# Patient Record
Sex: Female | Born: 2000 | Hispanic: No | Marital: Single | Smoking: Never smoker
Health system: Southern US, Community
[De-identification: ages and names within clinical notes are randomized; demographics above are authoritative.]

## PROBLEM LIST (undated history)

## (undated) DIAGNOSIS — E785 Hyperlipidemia, unspecified: Secondary | ICD-10-CM

---
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
## 2019-04-13 IMAGING — CR DG FINGER MIDDLE 2+V*R*
3 series · 3 of 3 positions shown · non-contrast
Comparison: None.

CLINICAL DATA: Pain following injury

EXAM:
RIGHT THIRD FINGER 2+V

[finger ap]
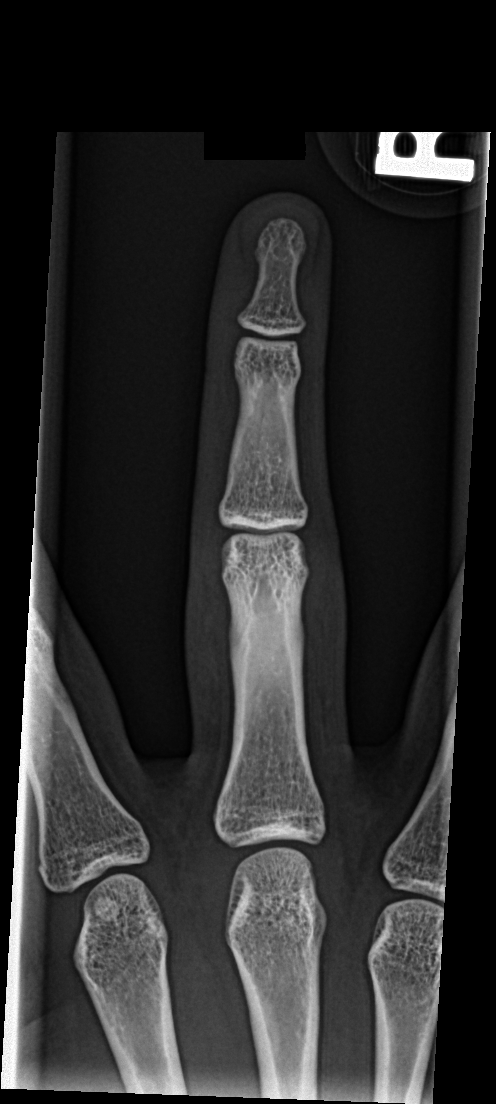

[finger obl]
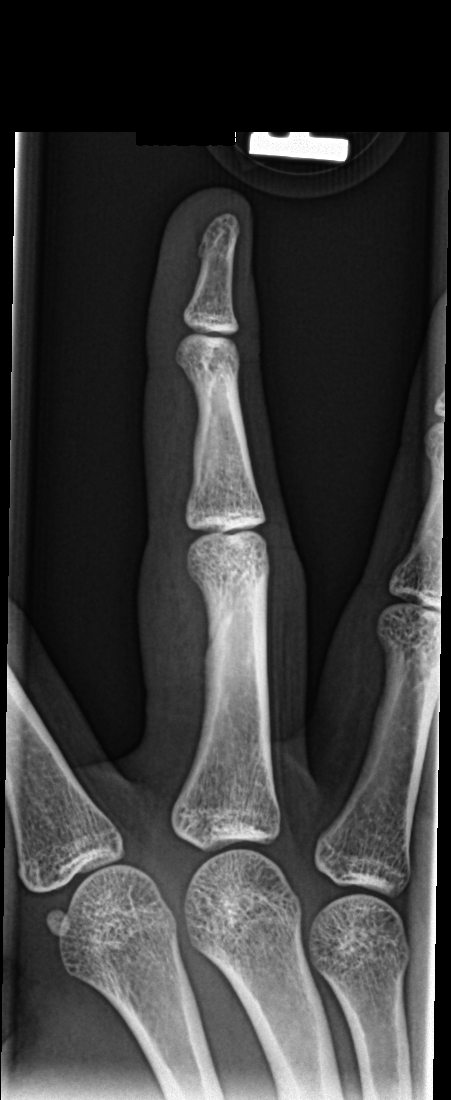

[finger lat]
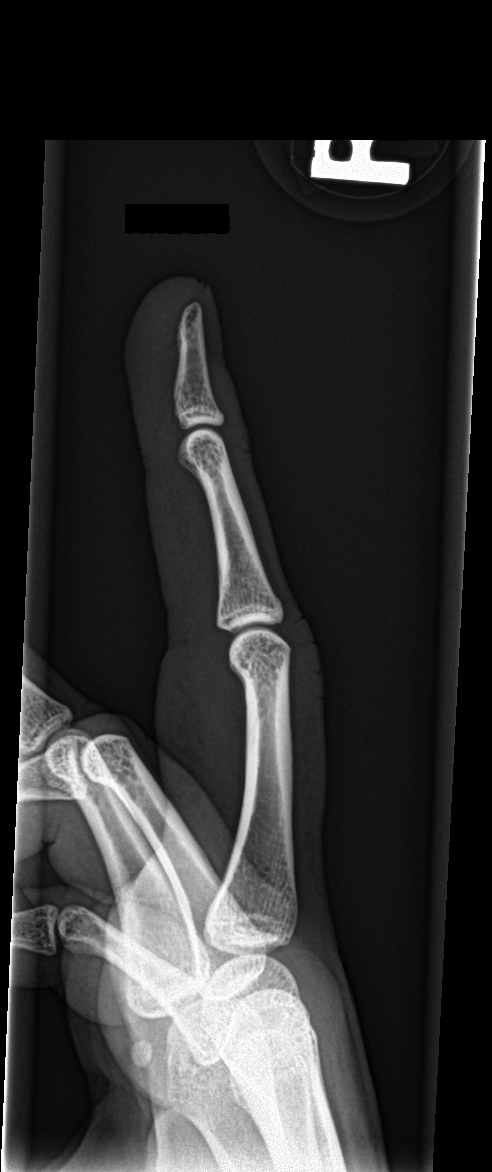

[3 of 3 positions shown; findings below may reference images not displayed]

FINDINGS: Frontal, oblique, and lateral views were obtained. There is mild
soft tissue swelling. No fracture or dislocation. Joint spaces
appear normal. No erosive change or radiopaque foreign body.
IMPRESSION: Mild soft tissue swelling. No fracture or dislocation. No evident
arthropathy.

## 2020-09-25 ENCOUNTER — Other Ambulatory Visit: Payer: Self-pay

## 2020-09-25 ENCOUNTER — Ambulatory Visit (INDEPENDENT_AMBULATORY_CARE_PROVIDER_SITE_OTHER): Payer: BC Managed Care – PPO | Admitting: Podiatry

## 2020-09-25 ENCOUNTER — Ambulatory Visit (INDEPENDENT_AMBULATORY_CARE_PROVIDER_SITE_OTHER): Payer: BC Managed Care – PPO

## 2020-09-25 VITALS — BP 116/71 | HR 81 | Temp 98.9°F | Resp 16

## 2020-09-25 DIAGNOSIS — Q6689 Other  specified congenital deformities of feet: Secondary | ICD-10-CM

## 2020-09-25 DIAGNOSIS — M659 Synovitis and tenosynovitis, unspecified: Secondary | ICD-10-CM

## 2020-09-25 MED ORDER — MELOXICAM 15 MG PO TABS: 15.0000 mg | ORAL_TABLET | Freq: Every day | ORAL | 3 refills | Status: DC

## 2020-09-25 NOTE — Progress Notes (Signed)
   Subjective:  19 y.o. female presenting today for evaluation of right ankle pain.  Patient has a history of right ankle pain in the past.  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  She has noticed increased pain to her right ankle with the clogging.  She has not done anything for treatment.  She presents for further treatment evaluation   Past Medical History:  Diagnosis Date  . Hyperlipidemia      Objective / Physical Exam:  General:  The patient is alert and oriented x3 in no acute distress. Dermatology:  Skin is warm, dry and supple bilateral lower extremities. Negative for open lesions or macerations. Vascular:  Palpable pedal pulses bilaterally. No edema or erythema noted. Capillary refill within normal limits. Neurological:  Epicritic and protective threshold grossly intact bilaterally.  Musculoskeletal Exam:  Pain on palpation to the anterior lateral medial aspects of the patient's right ankle. Mild edema noted. Range of motion within normal limits to all pedal and ankle joints bilateral. Muscle strength 5/5 in all groups bilateral.   Radiographic Exam:  Normal osseous mineralization. Joint spaces preserved. No fracture/dislocation/boney destruction.    Assessment: 1.  Right ankle synovitis  Plan of Care:  1. Patient was evaluated. X-Rays reviewed.  2.  Patient declined injection today Three.  Recommend OTC arch supports.  Patient has very high arches and arch supports would help alleviate pressure Four.  Refill prescription for meloxicam 15 mg daily Five.  Return to clinic as needed  *Going to [employer].  Elementary education.   Felecia Shelling, DPM [facility]  Dr. Felecia Shelling, DPM    [address]                                        [place], [place] [address]                Office [phone]  Fax [phone]

## 2021-08-31 ENCOUNTER — Other Ambulatory Visit: Payer: Self-pay | Admitting: Podiatry

## 2021-08-31 NOTE — Telephone Encounter (Signed)
Please advise, patient may need a f/u appointment since has not been seen in 1 year.(2021)

## 2021-09-14 NOTE — Telephone Encounter (Signed)
Meds approved.

## 2022-07-13 ENCOUNTER — Inpatient Hospital Stay: Admission: RE | Admit: 2022-07-13 | Payer: Self-pay | Source: Ambulatory Visit

## 2022-07-13 ENCOUNTER — Ambulatory Visit
Admission: EM | Admit: 2022-07-13 | Discharge: 2022-07-13 | Disposition: A | Discharge status: Home / Self Care | Payer: BC Managed Care – PPO | Attending: Emergency Medicine | Admitting: Emergency Medicine

## 2022-07-13 DIAGNOSIS — J069 Acute upper respiratory infection, unspecified: Secondary | ICD-10-CM | POA: Diagnosis not present

## 2022-07-13 DIAGNOSIS — Z20822 Contact with and (suspected) exposure to covid-19: Secondary | ICD-10-CM | POA: Insufficient documentation

## 2022-07-13 LAB — GROUP A STREP BY PCR: Group A Strep by PCR: NOT DETECTED

## 2022-07-13 LAB — SARS CORONAVIRUS 2 BY RT PCR: SARS Coronavirus 2 by RT PCR: NEGATIVE

## 2022-07-13 MED ORDER — IPRATROPIUM BROMIDE 0.06 % NA SOLN: 2.0000 | Freq: Four times a day (QID) | NASAL | 12 refills | Status: DC

## 2022-07-13 MED ORDER — PROMETHAZINE-DM 6.25-15 MG/5ML PO SYRP: 5.0000 mL | ORAL_SOLUTION | Freq: Four times a day (QID) | ORAL | 0 refills | Status: DC | PRN

## 2022-07-13 MED ORDER — BENZONATATE 100 MG PO CAPS: 200.0000 mg | ORAL_CAPSULE | Freq: Three times a day (TID) | ORAL | 0 refills | Status: DC

## 2022-07-13 NOTE — ED Provider Notes (Signed)
[facility]    CSN: [id number] Arrival date & time: 07/13/22  1348      History   Chief Complaint Chief Complaint  Patient presents with   Sore Throat    HPI Michelle Pena is a 21 y.o. female.   HPI  21 year old female here for evaluation of sore throat.  Patient reports her symptoms began 2 days ago with nasal congestion, clear nasal discharge, and a sore throat.  She is also endorsing a nonproductive cough.  Yesterday she woke up with a scratchy voice and this morning she woke up and reports that she is almost completely lost her voice.  She has not had a fever, denies ear pain, denies shortness breath or wheezing, and denies GI complaints.  No other sick contacts.  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 No pertinent surgical history.  OB History   No obstetric history on file.      Home Medications    Prior to Admission medications   Medication Sig Start Date End Date Taking? Authorizing Provider  benzonatate (TESSALON) 100 MG capsule Take 2 capsules (200 mg total) by mouth every 8 (eight) hours. 07/13/22  Yes Becky Augusta, NP  ipratropium (ATROVENT) 0.06 % nasal spray Place 2 sprays into both nostrils 4 (four) times daily. 07/13/22  Yes Becky Augusta, NP  promethazine-dextromethorphan (PROMETHAZINE-DM) 6.25-15 MG/5ML syrup Take 5 mLs by mouth 4 (four) times daily as needed. 07/13/22  Yes Becky Augusta, NP  rosuvastatin (CRESTOR) 20 MG tablet Take 20 mg by mouth daily.   Yes [provider]    Family History Family History  Problem Relation Age of Onset   Hyperlipidemia Mother    Migraines Mother    Diabetes Father    Hypertension Father     Social History Social History   Tobacco Use   Smoking status: Never   Smokeless tobacco: Never  Vaping Use   Vaping Use: Never used  Substance Use Topics   Alcohol  use: No    Alcohol/week: 0.0 standard drinks of alcohol   Drug use: No     Allergies   Patient has no known allergies.   Review of Systems Review of Systems  Constitutional:  Negative for fever.  HENT:  Positive for congestion, postnasal drip, rhinorrhea, sore throat and voice change. Negative for ear pain.   Respiratory:  Positive for cough. Negative for shortness of breath and wheezing.   Gastrointestinal:  Negative for diarrhea, nausea and vomiting.  Skin:  Negative for rash.  Hematological: Negative.   Psychiatric/Behavioral: Negative.       Physical Exam Triage Vital Signs ED Triage Vitals  Enc Vitals Group     BP 07/13/22 1414 (!) 141/97     Pulse Rate 07/13/22 1414 (!) 118     Resp --      Temp --      Temp src --      SpO2 07/13/22 1414 99 %     Weight 07/13/22 1415 189 lb (85.7 kg)     Height 07/13/22 1415 5\' 8"  (1.727 m)     Head Circumference --      Peak Flow --      Pain Score 07/13/22 1420 3     Pain Loc --      Pain Edu? --      Excl. in  GC? --    No data found.  Updated Vital Signs BP (!) 141/97 (BP Location: Left Arm)   Pulse (!) 118   Ht 5\' 8"  (1.727 m)   Wt 189 lb (85.7 kg)   LMP 06/29/2022 (Approximate)   SpO2 99%   BMI 28.74 kg/m   Visual Acuity Right Eye Distance:   Left Eye Distance:   Bilateral Distance:    Right Eye Near:   Left Eye Near:    Bilateral Near:     Physical Exam Vitals and nursing note reviewed.  Constitutional:      Appearance: Normal appearance. She is not ill-appearing.  HENT:     Head: Normocephalic and atraumatic.     Right Ear: Tympanic membrane, ear canal and external ear normal. There is no impacted cerumen.     Left Ear: Tympanic membrane, ear canal and external ear normal. There is no impacted cerumen.     Nose: Congestion and rhinorrhea present.     Mouth/Throat:     Mouth: Mucous membranes are moist.     Pharynx: Oropharynx is clear. Posterior oropharyngeal erythema present. No oropharyngeal  exudate.  Cardiovascular:     Rate and Rhythm: Normal rate and regular rhythm.     Pulses: Normal pulses.     Heart sounds: Normal heart sounds. No murmur heard.    No friction rub. No gallop.  Pulmonary:     Effort: Pulmonary effort is normal.     Breath sounds: Normal breath sounds. No wheezing, rhonchi or rales.  Musculoskeletal:     Cervical back: Normal range of motion and neck supple.  Lymphadenopathy:     Cervical: No cervical adenopathy.  Skin:    General: Skin is warm and dry.     Capillary Refill: Capillary refill takes less than 2 seconds.     Findings: No erythema or rash.  Neurological:     General: No focal deficit present.     Mental Status: She is alert and oriented to person, place, and time.  Psychiatric:        Mood and Affect: Mood normal.        Behavior: Behavior normal.        Thought Content: Thought content normal.        Judgment: Judgment normal.      UC Treatments / Results  Labs (all labs ordered are listed, but only abnormal results are displayed) Labs Reviewed  GROUP A STREP BY PCR  SARS CORONAVIRUS 2 BY RT PCR    EKG   Radiology No results found.  Procedures Procedures (including critical care time)  Medications Ordered in UC Medications - No data to display  Initial Impression / Assessment and Plan / UC Course  I have reviewed the triage vital signs and the nursing notes.  Pertinent labs & imaging results that were available during my care of the patient were reviewed by me and considered in my medical decision making (see chart for details).   Patient is a very pleasant, nontoxic-appearing 21 year old female here for evaluation of upper respiratory complaints with the most predominant complaint being a sore throat and hoarseness.  Her physical exam reveals pearly-gray tympanic membranes bilaterally with normal light reflex and clear external auditory canals.  Nasal mucosa is erythematous and edematous with clear discharge in both  nares.  Oropharyngeal exam reveals 2+ erythematous and edematous tonsillar pillars with no exudate.  Posterior oropharynx also has erythema and injection with clear postnasal drip.  No anterior cervical lymphadenopathy  appreciated on exam.  Cardiopulmonary exam reveals S1-S2 heart sounds with regular rate and rhythm lung sounds are clear to auscultation in all fields.  I will check strep PCR given the patient's throat is erythematous and edematous and is her most significant symptom.  Her symptoms are consistent with a viral upper respiratory infection as well.  Strep PCR is negative.  I discussed with patient that her symptoms are most closely associated with a viral upper respiratory infection.  I did offer COVID testing and she has consented.  I will order a COVID PCR.  COVID PCR is negative.  I will discharge patient home with a diagnosis of viral URI with a cough and treat her with Atrovent nasal spray, Tessalon Perles, and Promethazine DM cough syrup.   Final Clinical Impressions(s) / UC Diagnoses   Final diagnoses:  Viral URI with cough     Discharge Instructions      Your test today were negative for both COVID and strep.  I believe you have a viral upper respiratory infection.  Please use the following medications to help your symptoms.  Use the Atrovent nasal spray, 2 squirts in each nostril every 6 hours, as needed for runny nose and postnasal drip.  Use the Tessalon Perles every 8 hours during the day.  Take them with a small sip of water.  They may give you some numbness to the base of your tongue or a metallic taste in your mouth, this is normal.  Use the Promethazine DM cough syrup at bedtime for cough and congestion.  It will make you drowsy so do not take it during the day.  Use over-the-counter Tylenol and ibuprofen according to package instructions as needed for any pain you might have.  Return for reevaluation or see your primary care provider for any new or  worsening symptoms.      ED Prescriptions     Medication Sig Dispense Auth. Provider   benzonatate (TESSALON) 100 MG capsule Take 2 capsules (200 mg total) by mouth every 8 (eight) hours. 21 capsule Becky Augusta, NP   ipratropium (ATROVENT) 0.06 % nasal spray Place 2 sprays into both nostrils 4 (four) times daily. 15 mL Becky Augusta, NP   promethazine-dextromethorphan (PROMETHAZINE-DM) 6.25-15 MG/5ML syrup Take 5 mLs by mouth 4 (four) times daily as needed. 118 mL Becky Augusta, NP      PDMP not reviewed this encounter.   Becky Augusta, NP 07/13/22 [id number]

## 2022-07-13 NOTE — ED Triage Notes (Signed)
Pt states sore throat started Sunday.  Woke up today w/o voice.

## 2022-07-13 NOTE — Discharge Instructions (Addendum)
Your test today were negative for both COVID and strep.  I believe you have a viral upper respiratory infection.  Please use the following medications to help your symptoms.  Use the Atrovent nasal spray, 2 squirts in each nostril every 6 hours, as needed for runny nose and postnasal drip.  Use the Tessalon Perles every 8 hours during the day.  Take them with a small sip of water.  They may give you some numbness to the base of your tongue or a metallic taste in your mouth, this is normal.  Use the Promethazine DM cough syrup at bedtime for cough and congestion.  It will make you drowsy so do not take it during the day.  Use over-the-counter Tylenol and ibuprofen according to package instructions as needed for any pain you might have.  Return for reevaluation or see your primary care provider for any new or worsening symptoms.

## 2023-09-22 ENCOUNTER — Ambulatory Visit
Admission: EM | Admit: 2023-09-22 | Discharge: 2023-09-22 | Disposition: A | Discharge status: Home / Self Care | Payer: 59

## 2023-09-22 DIAGNOSIS — B084 Enteroviral vesicular stomatitis with exanthem: Secondary | ICD-10-CM | POA: Diagnosis not present

## 2023-09-22 DIAGNOSIS — K1370 Unspecified lesions of oral mucosa: Secondary | ICD-10-CM | POA: Diagnosis not present

## 2023-09-22 NOTE — ED Provider Notes (Signed)
Renaldo Fiddler    CSN: [id number] Arrival date & time: 09/22/23  1611      History   Chief Complaint Chief Complaint  Patient presents with   Mouth Lesions    HPI Michelle Pena is a 22 y.o. female.   22 year old female, Michelle Pena, presents to urgent care for evaluation of mouth lesions.  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  Patient denies fevers.  No treatment prior to arrival.  Patient is eating and drinking well.  The history is provided by the patient. No language interpreter was used.    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 No pertinent surgical history.  OB History   No obstetric history on file.      Home Medications    Prior to Admission medications   Medication Sig Start Date End Date Taking? Authorizing Provider  benzonatate (TESSALON) 100 MG capsule Take 2 capsules (200 mg total) by mouth every 8 (eight) hours. 07/13/22   Becky Augusta, NP  ipratropium (ATROVENT) 0.06 % nasal spray Place 2 sprays into both nostrils 4 (four) times daily. 07/13/22   Becky Augusta, NP  promethazine-dextromethorphan (PROMETHAZINE-DM) 6.25-15 MG/5ML syrup Take 5 mLs by mouth 4 (four) times daily as needed. 07/13/22   Becky Augusta, NP  rosuvastatin (CRESTOR) 20 MG tablet Take 20 mg by mouth daily.    [provider]    Family History Family History  Problem Relation Age of Onset   Hyperlipidemia Mother    Migraines Mother    Diabetes Father    Hypertension Father     Social History Social History   Tobacco Use   Smoking status: Never   Smokeless tobacco: Never  Vaping Use   Vaping status: Never Used  Substance Use Topics   Alcohol use: No    Alcohol/week:  0.0 standard drinks of alcohol   Drug use: No     Allergies   Amoxicillin   Review of Systems Review of Systems  Constitutional:  Negative for fever.  HENT:  Positive for mouth sores.   All other systems reviewed and are negative.    Physical Exam Triage Vital Signs ED Triage Vitals  Encounter Vitals Group     BP 09/22/23 1627 113/74     Systolic BP Percentile --      Diastolic BP Percentile --      Pulse Rate 09/22/23 1627 94     Resp 09/22/23 1627 16     Temp 09/22/23 1627 98.8 F (37.1 C)     Temp Source 09/22/23 1627 Oral     SpO2 09/22/23 1627 98 %     Weight --      Height --      Head Circumference --      Peak Flow --      Pain Score 09/22/23 1617 3     Pain Loc --      Pain Education --      Exclude from Growth Chart --    No data found.  Updated Vital Signs BP 113/74 (BP Location: Left Arm)   Pulse 94   Temp 98.8 F (37.1 C) (Oral)   Resp 16   LMP  09/15/2023 (Approximate)   SpO2 98%   Visual Acuity Right Eye Distance:   Left Eye Distance:   Bilateral Distance:    Right Eye Near:   Left Eye Near:    Bilateral Near:     Physical Exam Vitals and nursing note reviewed.  Constitutional:      Appearance: Normal appearance. She is well-developed and well-groomed. She is not ill-appearing.  HENT:     Head: Normocephalic.     Right Ear: Tympanic membrane normal.     Left Ear: Tympanic membrane normal.     Nose: Nose normal.     Mouth/Throat:     Lips: Pink.     Mouth: Mucous membranes are moist. Oral lesions present.     Palate: Lesions present.     Tonsils: No tonsillar exudate or tonsillar abscesses.  Cardiovascular:     Rate and Rhythm: Normal rate and regular rhythm.     Pulses: Normal pulses.     Heart sounds: Normal heart sounds.  Pulmonary:     Effort: Pulmonary effort is normal.     Breath sounds: Normal breath sounds and air entry.  Musculoskeletal:     Cervical back: Normal range of motion.  Lymphadenopathy:     Cervical:  No cervical adenopathy.  Neurological:     General: No focal deficit present.     Mental Status: She is alert and oriented to person, place, and time.     GCS: GCS eye subscore is 4. GCS verbal subscore is 5. GCS motor subscore is 6.     Cranial Nerves: No cranial nerve deficit.     Sensory: No sensory deficit.  Psychiatric:        Attention and Perception: Attention normal.        Mood and Affect: Mood normal.        Speech: Speech normal.        Behavior: Behavior normal. Behavior is cooperative.      UC Treatments / Results  Labs (all labs ordered are listed, but only abnormal results are displayed) Labs Reviewed - No data to display  EKG   Radiology No results found.  Procedures Procedures (including critical care time)  Medications Ordered in UC Medications - No data to display  Initial Impression / Assessment and Plan / UC Course  I have reviewed the triage vital signs and the nursing notes.  Pertinent labs & imaging results that were available during my care of the patient were reviewed by me and considered in my medical decision making (see chart for details).    Discussed exam findings and plan of care with patient, strict go to ER precautions given.   Patient verbalized understanding to this provider.  Ddx: Foot mouth disease, mouth lesions, viral illness, aphthous ulcers, allergies Final Clinical Impressions(s) / UC Diagnoses   Final diagnoses:  Hand, foot and mouth disease  Lesion of mouth     Discharge Instructions      Stay hydrated. You are considered contagious, handout given regarding precautions.    ED Prescriptions   None    PDMP not reviewed this encounter.   Clancy Gourd, NP 09/22/23 2027

## 2023-09-22 NOTE — Discharge Instructions (Addendum)
Stay hydrated. You are considered contagious, handout given regarding precautions.

## 2023-09-22 NOTE — ED Triage Notes (Signed)
Patient presents to UC for hand foot mouth x today. 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  Denies fever.

## 2023-11-23 ENCOUNTER — Ambulatory Visit
Admission: RE | Admit: 2023-11-23 | Discharge: 2023-11-23 | Disposition: A | Discharge status: Home / Self Care | Payer: 59 | Source: Ambulatory Visit | Attending: Emergency Medicine | Admitting: Emergency Medicine

## 2023-11-23 VITALS — BP 129/82 | HR 76 | Temp 98.2°F | Resp 16

## 2023-11-23 DIAGNOSIS — H9202 Otalgia, left ear: Secondary | ICD-10-CM | POA: Diagnosis not present

## 2023-11-23 MED ORDER — CIPROFLOXACIN-DEXAMETHASONE 0.3-0.1 % OT SUSP: 4.0000 [drp] | Freq: Two times a day (BID) | OTIC | 0 refills | Status: DC

## 2023-11-23 NOTE — Discharge Instructions (Addendum)
 Your evaluated for discomfort to your ears, on exam there is no overt signs of infection, symptoms possibly related to sinuses  On Exam your right ear is clogged by wax  Prophylactically being placed on eardrops, Place 4 drops of Ciprodex  which is a mixture of antibiotic and a steroid into the left ear every morning and every evening for 7 days  You may continue use of Sudafed and Advil  as needed, if continuing use of allergy eardrops with at least 5 minutes between administration so medicines do not mix  You may use Tylenol or ibuprofen  for management of discomfort  May hold warm compresses to the ear for additional comfort  Please not attempted any ear cleaning or object or fluid placement into the ear canal to prevent further irritation

## 2023-11-23 NOTE — ED Provider Notes (Signed)
 Michelle Pena    CSN: [id number] Arrival date & time: 11/23/23  1555      History   Chief Complaint Chief Complaint  Patient presents with   Ear Fullness    Left ear pain - Entered by patient   Otalgia    HPI Michelle Pena is a 23 y.o. female.   Patient presents for evaluation of left-sided ear pain and fullness present for 2 days.  Denies fever, nasal congestion, cough or sore throat.  No known sick contacts prior.  Tolerating food and liquids.  Has attempted use of Advil , Sudafed and over-the-counter allergy eardrops with no improvement.  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 No pertinent surgical history.  OB History   No obstetric history on file.      Home Medications    Prior to Admission medications   Medication Sig Start Date End Date Taking? Authorizing Provider  ciprofloxacin -dexamethasone  (CIPRODEX ) OTIC suspension Place 4 drops into the left ear 2 (two) times daily. 11/23/23  Yes Rasha Ibe R, NP  rosuvastatin (CRESTOR) 20 MG tablet Take 20 mg by mouth daily.   Yes [provider]  benzonatate  (TESSALON ) 100 MG capsule Take 2 capsules (200 mg total) by mouth every 8 (eight) hours. 07/13/22   Bernardino Ditch, NP  ipratropium (ATROVENT ) 0.06 % nasal spray Place 2 sprays into both nostrils 4 (four) times daily. 07/13/22   Bernardino Ditch, NP  promethazine -dextromethorphan (PROMETHAZINE -DM) 6.25-15 MG/5ML syrup Take 5 mLs by mouth 4 (four) times daily as needed. 07/13/22   Bernardino Ditch, NP    Family History Family History  Problem Relation Age of Onset   Hyperlipidemia Mother    Migraines Mother    Diabetes Father    Hypertension Father     Social History Social History   Tobacco Use   Smoking status: Never   Smokeless tobacco: Never   Vaping Use   Vaping status: Never Used  Substance Use Topics   Alcohol use: No    Alcohol/week: 0.0 standard drinks of alcohol   Drug use: No     Allergies   Amoxicillin    Review of Systems Review of Systems   Physical Exam Triage Vital Signs ED Triage Vitals  Encounter Vitals Group     BP 11/23/23 1614 129/82     Systolic BP Percentile --      Diastolic BP Percentile --      Pulse Rate 11/23/23 1614 76     Resp 11/23/23 1614 16     Temp 11/23/23 1614 98.2 F (36.8 C)     Temp Source 11/23/23 1614 Oral     SpO2 11/23/23 1614 98 %     Weight --      Height --      Head Circumference --      Peak Flow --      Pain Score 11/23/23 1615 5     Pain Loc --      Pain Education --      Exclude from Growth Chart --    No data found.  Updated Vital Signs BP 129/82 (BP Location: Left Arm)   Pulse 76   Temp 98.2 F (36.8 C) (Oral)   Resp 16   LMP 11/07/2023 (Exact Date)  SpO2 98%   Visual Acuity Right Eye Distance:   Left Eye Distance:   Bilateral Distance:    Right Eye Near:   Left Eye Near:    Bilateral Near:     Physical Exam Constitutional:      Appearance: Normal appearance.  HENT:     Head: Normocephalic.     Right Ear: Tympanic membrane, ear canal and external ear normal.     Left Ear: Tympanic membrane, ear canal and external ear normal.  Eyes:     Extraocular Movements: Extraocular movements intact.  Pulmonary:     Effort: Pulmonary effort is normal.  Neurological:     Mental Status: She is alert and oriented to person, place, and time.      UC Treatments / Results  Labs (all labs ordered are listed, but only abnormal results are displayed) Labs Reviewed - No data to display  EKG   Radiology No results found.  Procedures Procedures (including critical care time)  Medications Ordered in UC Medications - No data to display  Initial Impression / Assessment and Plan / UC Course  I have reviewed the triage vital signs and the  nursing notes.  Pertinent labs & imaging results that were available during my care of the patient were reviewed by me and considered in my medical decision making (see chart for details).  Otalgia of left ear  No overt abnormality indicating infection noted on exam, prophylactically placed on Ciprodex  as she is symptomatic, etiology possibly related to sinuses, discussed with patient recommended continued use of Sudafed and Advil  for management of pain.,advised against ear cleaning, may use over-the-counter analgesics and warm compresses to the external ear for comfort, may follow-up if symptoms persist worsen or recur  Final Clinical Impressions(s) / UC Diagnoses   Final diagnoses:  Otalgia of left ear     Discharge Instructions      Your evaluated for discomfort to your ears, on exam there is no overt signs of infection, symptoms possibly related to sinuses  On Exam your right ear is clogged by wax  Prophylactically being placed on eardrops, Place 4 drops of Ciprodex  which is a mixture of antibiotic and a steroid into the left ear every morning and every evening for 7 days  You may continue use of Sudafed and Advil  as needed, if continuing use of allergy eardrops with at least 5 minutes between administration so medicines do not mix  You may use Tylenol or ibuprofen  for management of discomfort  May hold warm compresses to the ear for additional comfort  Please not attempted any ear cleaning or object or fluid placement into the ear canal to prevent further irritation    ED Prescriptions     Medication Sig Dispense Auth. Provider   ciprofloxacin -dexamethasone  (CIPRODEX ) OTIC suspension Place 4 drops into the left ear 2 (two) times daily. 7.5 mL Michelle Shelba SAUNDERS, NP      PDMP not reviewed this encounter.   Michelle Shelba SAUNDERS, NP 11/23/23 1644

## 2023-11-23 NOTE — ED Triage Notes (Signed)
 Left ear pain/ fullness that started yesterday. Taking Advil and sudafed and OTC cold and flu allergy ear drops with no relief of symptoms.

## 2023-12-22 DIAGNOSIS — R6889 Other general symptoms and signs: Secondary | ICD-10-CM | POA: Diagnosis not present

## 2023-12-30 ENCOUNTER — Ambulatory Visit
Admission: EM | Admit: 2023-12-30 | Discharge: 2023-12-30 | Disposition: A | Discharge status: Home / Self Care | Payer: 59 | Attending: Internal Medicine | Admitting: Internal Medicine

## 2023-12-30 ENCOUNTER — Encounter: Payer: Self-pay | Admitting: Internal Medicine

## 2023-12-30 ENCOUNTER — Other Ambulatory Visit: Payer: Self-pay

## 2023-12-30 DIAGNOSIS — H6692 Otitis media, unspecified, left ear: Secondary | ICD-10-CM | POA: Diagnosis not present

## 2023-12-30 MED ORDER — DOXYCYCLINE HYCLATE 100 MG PO CAPS: 100.0000 mg | ORAL_CAPSULE | Freq: Two times a day (BID) | ORAL | 0 refills | Status: DC

## 2023-12-30 NOTE — ED Triage Notes (Addendum)
 Patient presents to [facility] for evaluation of left ear pain since last night.  Just got over the flu.  Patient has had yellow-ish discharge since 2am this morning when she was woken up by the pain.  Patient states she used her home Cipro/Dex drops from her last ear infection and that is when the discharge started    Patient does not swallow pills well, but can do a capsule that can be opened and poured

## 2023-12-30 NOTE — ED Provider Notes (Signed)
 [facility]    CSN: [id number] Arrival date & time: 12/30/23  0807      History   Chief Complaint Chief Complaint  Patient presents with   Ear Pain    HPI Michelle Pena is a 23 y.o. female who presents with L ear pain since last night and woke up at 2 am with worse pain, so she applied Cipro/Dex ear drops she had from prior ear infection, and since then her ear started draining yellow matter. Had the flu last week and her cough is better. Has not had a fever this week.     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 No pertinent surgical history.  OB History   No obstetric history on file.      Home Medications    Prior to Admission medications   Medication Sig Start Date End Date Taking? Authorizing Provider  benzonatate (TESSALON) 100 MG capsule Take 2 capsules (200 mg total) by mouth every 8 (eight) hours. 07/13/22   Becky Augusta, NP  ciprofloxacin-dexamethasone (CIPRODEX) OTIC suspension Place 4 drops into the left ear 2 (two) times daily. 11/23/23   White, Elita Boone, NP  ipratropium (ATROVENT) 0.06 % nasal spray Place 2 sprays into both nostrils 4 (four) times daily. 07/13/22   Becky Augusta, NP  promethazine-dextromethorphan (PROMETHAZINE-DM) 6.25-15 MG/5ML syrup Take 5 mLs by mouth 4 (four) times daily as needed. 07/13/22   Becky Augusta, NP  rosuvastatin (CRESTOR) 20 MG tablet Take 20 mg by mouth daily.    [provider]    Family History Family History  Problem Relation Age of Onset   Hyperlipidemia Mother    Migraines Mother    Diabetes Father    Hypertension Father     Social History Social History   Tobacco Use   Smoking status: Never   Smokeless tobacco: Never  Vaping Use   Vaping status: Never Used  Substance Use Topics   Alcohol  use: No    Alcohol/week: 0.0 standard drinks of alcohol   Drug use: No     Allergies   Amoxicillin   Review of Systems Review of Systems As noted in HPI  Physical Exam Triage Vital Signs ED Triage Vitals  Encounter Vitals Group     BP 12/30/23 0820 126/83     Systolic BP Percentile --      Diastolic BP Percentile --      Pulse Rate 12/30/23 0820 93     Resp 12/30/23 0820 18     Temp 12/30/23 0820 98.7 F (37.1 C)     Temp Source 12/30/23 0820 Oral     SpO2 12/30/23 0820 98 %     Weight --      Height --      Head Circumference --      Peak Flow --      Pain Score 12/30/23 0821 5     Pain Loc --      Pain Education --      Exclude from Growth Chart --    No data found.  Updated Vital Signs BP 126/83 (BP Location: Left Arm)   Pulse 93   Temp 98.7 F (37.1 C) (Oral)   Resp 18   LMP 12/06/2023   SpO2 98%  Visual Acuity Right Eye Distance:   Left Eye Distance:   Bilateral Distance:    Right Eye Near:   Left Eye Near:    Bilateral Near:     Physical Exam Vitals and nursing note reviewed.  Constitutional:      General: She is not in acute distress.    Appearance: She is not toxic-appearing.  HENT:     Right Ear: Ear canal and external ear normal.     Left Ear: Ear canal normal.     Ears:     Comments: R TM pink and dull, L TM erythematous with cream color matter on bottom of it. I do not see a perforation, but the matter could be obscuring it.     Nose: Nose normal.  Eyes:     General: No scleral icterus.    Conjunctiva/sclera: Conjunctivae normal.  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     Heart sounds: No murmur heard. Pulmonary:     Effort: Pulmonary effort is normal.     Breath sounds: Normal breath sounds.  Musculoskeletal:        General: Normal range of motion.     Cervical back: Neck supple.  Lymphadenopathy:     Cervical: No cervical adenopathy.   Skin:    General: Skin is warm and dry.  Neurological:     Mental Status: She is oriented to person, place, and time.     Gait: Gait normal.  Psychiatric:        Mood and Affect: Mood normal.        Behavior: Behavior normal.        Thought Content: Thought content normal.        Judgment: Judgment normal.      UC Treatments / Results  Labs (all labs ordered are listed, but only abnormal results are displayed) Labs Reviewed - No data to display  EKG   Radiology No results found.  Procedures Procedures (including critical care time)  Medications Ordered in UC Medications - No data to display  Initial Impression / Assessment and Plan / UC Course  I have reviewed the triage vital signs and the nursing notes.  Purulent L OM  I placed her on Doxy capsules as noted since she can't swallow pills. May continue the Cipro drops Needs to FU with PCP in 7-10 days   Final Clinical Impressions(s) / UC Diagnoses   Final diagnoses:  None   Discharge Instructions   None    ED Prescriptions   None    PDMP not reviewed this encounter.   Garey Ham, [place] 12/30/23 [phone]

## 2023-12-30 NOTE — Discharge Instructions (Signed)
 Continue using the Cipro drops for 5 days as directed in the bottle Follow up with your primary care doctor to recheck your ear drum in 7-10 days

## 2024-01-03 DIAGNOSIS — H66015 Acute suppurative otitis media with spontaneous rupture of ear drum, recurrent, left ear: Secondary | ICD-10-CM | POA: Diagnosis not present

## 2024-01-26 DIAGNOSIS — H6983 Other specified disorders of Eustachian tube, bilateral: Secondary | ICD-10-CM | POA: Diagnosis not present

## 2024-01-26 DIAGNOSIS — H663X2 Other chronic suppurative otitis media, left ear: Secondary | ICD-10-CM | POA: Diagnosis not present

## 2024-02-01 DIAGNOSIS — E782 Mixed hyperlipidemia: Secondary | ICD-10-CM | POA: Diagnosis not present

## 2024-02-01 DIAGNOSIS — Z0000 Encounter for general adult medical examination without abnormal findings: Secondary | ICD-10-CM | POA: Diagnosis not present

## 2024-02-01 DIAGNOSIS — Z1330 Encounter for screening examination for mental health and behavioral disorders, unspecified: Secondary | ICD-10-CM | POA: Diagnosis not present

## 2024-02-01 DIAGNOSIS — Z131 Encounter for screening for diabetes mellitus: Secondary | ICD-10-CM | POA: Diagnosis not present

## 2024-02-01 DIAGNOSIS — Z1331 Encounter for screening for depression: Secondary | ICD-10-CM | POA: Diagnosis not present

## 2024-02-01 DIAGNOSIS — Z23 Encounter for immunization: Secondary | ICD-10-CM | POA: Diagnosis not present

## 2024-03-29 DIAGNOSIS — J302 Other seasonal allergic rhinitis: Secondary | ICD-10-CM | POA: Diagnosis not present

## 2024-03-29 DIAGNOSIS — J040 Acute laryngitis: Secondary | ICD-10-CM | POA: Diagnosis not present

## 2024-03-29 DIAGNOSIS — R0982 Postnasal drip: Secondary | ICD-10-CM | POA: Diagnosis not present

## 2024-05-02 ENCOUNTER — Other Ambulatory Visit: Payer: Self-pay

## 2024-05-02 ENCOUNTER — Encounter: Payer: Self-pay | Admitting: Emergency Medicine

## 2024-05-02 ENCOUNTER — Ambulatory Visit
Admission: EM | Admit: 2024-05-02 | Discharge: 2024-05-02 | Disposition: A | Discharge status: Home / Self Care | Attending: Family Medicine | Admitting: Family Medicine

## 2024-05-02 DIAGNOSIS — N3000 Acute cystitis without hematuria: Secondary | ICD-10-CM | POA: Diagnosis not present

## 2024-05-02 LAB — POCT URINALYSIS DIP (MANUAL ENTRY)
Bilirubin, UA: NEGATIVE
Glucose, UA: NEGATIVE mg/dL
Ketones, POC UA: NEGATIVE mg/dL
Nitrite, UA: NEGATIVE
Protein Ur, POC: NEGATIVE mg/dL
Spec Grav, UA: 1.015 (ref 1.010–1.025)
Urobilinogen, UA: 0.2 U/dL
pH, UA: 7.5 (ref 5.0–8.0)

## 2024-05-02 MED ORDER — PHENAZOPYRIDINE HCL 200 MG PO TABS: 200.0000 mg | ORAL_TABLET | Freq: Three times a day (TID) | ORAL | 0 refills | Status: AC | PRN

## 2024-05-02 MED ORDER — NITROFURANTOIN MONOHYD MACRO 100 MG PO CAPS: 100.0000 mg | ORAL_CAPSULE | Freq: Two times a day (BID) | ORAL | 0 refills | Status: AC

## 2024-05-02 MED ORDER — FLUCONAZOLE 150 MG PO TABS: 150.0000 mg | ORAL_TABLET | Freq: Once | ORAL | 0 refills | Status: AC

## 2024-05-02 NOTE — ED Triage Notes (Signed)
 Pt reports dysuria, urinary frequency with voiding since Tuesday.started taking AZO last night.

## 2024-05-02 NOTE — ED Provider Notes (Signed)
 [facility]    CSN: [id number] Arrival date & time: 05/02/24  0935      History   Chief Complaint Chief Complaint  Patient presents with   Dysuria    HPI Michelle Pena is a 23 y.o. female.   Patient presents today with 2-day history of dysuria, urinary frequency, and sharp pain at the end of voiding.  She also endorses a foul urinary odor.  No new urinary incontinence, abdominal pain, flank pain, fever, nausea/vomiting, or vaginal discharge.  Is not sexually active and is not concerned for STIs.  Reports last menstrual cycle 04/27/2024, completely stopped yesterday.  Has taken Azo which did help with symptoms until the medicine wore off.  Reports she went to the beach last week and thinks that may have caused symptoms.  Reports history of UTI, last was about 1 year ago.    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 No pertinent surgical history.  OB History   No obstetric history on file.      Home Medications    Prior to Admission medications   Medication Sig Start Date End Date Taking? Authorizing Provider  fluconazole (DIFLUCAN) 150 MG tablet Take 1 tablet (150 mg total) by mouth once for 1 dose. 05/02/24 05/02/24 Yes Wilhemena Harbour, NP  nitrofurantoin, macrocrystal-monohydrate, (MACROBID) 100 MG capsule Take 1 capsule (100 mg total) by mouth 2 (two) times daily for 5 days. 05/02/24 05/07/24 Yes Wilhemena Harbour, NP  phenazopyridine (PYRIDIUM) 200 MG tablet Take 1 tablet (200 mg total) by mouth 3 (three) times daily as needed for pain (bladder pain). 05/02/24  Yes Thena Fireman A, NP  rosuvastatin (CRESTOR) 20 MG tablet Take 20 mg by mouth daily.    [provider]    Family History Family History  Problem Relation Age of Onset    Hyperlipidemia Mother    Migraines Mother    Diabetes Father    Hypertension Father     Social History Social History   Tobacco Use   Smoking status: Never   Smokeless tobacco: Never  Vaping Use   Vaping status: Never Used  Substance Use Topics   Alcohol use: No    Alcohol/week: 0.0 standard drinks of alcohol   Drug use: No     Allergies   Amoxicillin    Review of Systems Review of Systems Per HPI  Physical Exam Triage Vital Signs ED Triage Vitals  Encounter Vitals Group     BP 05/02/24 1016 128/85     Girls Systolic BP Percentile --      Girls Diastolic BP Percentile --      Boys Systolic BP Percentile --      Boys Diastolic BP Percentile --      Pulse Rate 05/02/24 1016 95     Resp 05/02/24 1016 20     Temp 05/02/24 1016 98.4 F (36.9 C)     Temp Source 05/02/24 1016 Oral     SpO2 05/02/24 1016 97 %     Weight --      Height --      Head Circumference --      Peak Flow --      Pain Score 05/02/24 1014 0     Pain Loc --  Pain Education --      Exclude from Growth Chart --    No data found.  Updated Vital Signs BP 128/85 (BP Location: Right Arm)   Pulse 95   Temp 98.4 F (36.9 C) (Oral)   Resp 20   LMP 04/27/2024 (Approximate)   SpO2 97%   Visual Acuity Right Eye Distance:   Left Eye Distance:   Bilateral Distance:    Right Eye Near:   Left Eye Near:    Bilateral Near:     Physical Exam Vitals and nursing note reviewed.  Constitutional:      General: She is not in acute distress.    Appearance: She is not toxic-appearing.  HENT:     Mouth/Throat:     Mouth: Mucous membranes are moist.     Pharynx: Oropharynx is clear.  Pulmonary:     Effort: Pulmonary effort is normal. No respiratory distress.  Abdominal:     General: Abdomen is flat. Bowel sounds are normal. There is no distension.     Palpations: Abdomen is soft. There is no mass.     Tenderness: There is no abdominal tenderness. There is no right CVA tenderness, left CVA  tenderness or guarding.   Skin:    General: Skin is warm and dry.     Coloration: Skin is not jaundiced or pale.     Findings: No erythema.   Neurological:     Mental Status: She is alert and oriented to person, place, and time.     Motor: No weakness.     Gait: Gait normal.   Psychiatric:        Behavior: Behavior is cooperative.      UC Treatments / Results  Labs (all labs ordered are listed, but only abnormal results are displayed) Labs Reviewed  POCT URINALYSIS DIP (MANUAL ENTRY) - Abnormal; Notable for the following components:      Result Value   Blood, UA moderate (*)    Leukocytes, UA Trace (*)    All other components within normal limits  URINE CULTURE    EKG   Radiology No results found.  Procedures Procedures (including critical care time)  Medications Ordered in UC Medications - No data to display  Initial Impression / Assessment and Plan / UC Course  I have reviewed the triage vital signs and the nursing notes.  Pertinent labs & imaging results that were available during my care of the patient were reviewed by me and considered in my medical decision making (see chart for details).   Patient is well-appearing, normotensive, afebrile, not tachycardic, not tachypneic, oxygenating well on room air.   1. Acute cystitis without hematuria Vitals and exam are reassuring today Urinalysis shows moderate blood with trace leukocyte Estrace Urine culture is pending Treat with Macrobid twice daily for 5 days Patient also requests fluconazole to treat for yeast infection if she develops symptoms/prescription sent as well as Pyridium to continue as needed for bladder pain Return and ER precautions discussed with patient  The patient was given the opportunity to ask questions.  All questions answered to their satisfaction.  The patient is in agreement to this plan.   Final Clinical Impressions(s) / UC Diagnoses   Final diagnoses:  Acute cystitis without  hematuria     Discharge Instructions      We are treating you for a UTI today.  Take the Macrobid as prescribed to treat it.  Make sure you continue to hydrate with plenty of fluids.  We will contact you if the urine culture shows we need to change the antibiotic next week.  You can continue the pyridium as needed for bladder pain.  Seek care if symptoms do not improve with treatment.      ED Prescriptions     Medication Sig Dispense Auth. Provider   nitrofurantoin, macrocrystal-monohydrate, (MACROBID) 100 MG capsule Take 1 capsule (100 mg total) by mouth 2 (two) times daily for 5 days. 10 capsule Thena Fireman A, NP   fluconazole (DIFLUCAN) 150 MG tablet Take 1 tablet (150 mg total) by mouth once for 1 dose. 1 tablet Thena Fireman A, NP   phenazopyridine (PYRIDIUM) 200 MG tablet Take 1 tablet (200 mg total) by mouth 3 (three) times daily as needed for pain (bladder pain). 6 tablet Wilhemena Harbour, NP      PDMP not reviewed this encounter.   Wilhemena Harbour, NP 05/02/24 1053

## 2024-05-02 NOTE — Discharge Instructions (Signed)
 We are treating you for a UTI today.  Take the Macrobid as prescribed to treat it.  Make sure you continue to hydrate with plenty of fluids.  We will contact you if the urine culture shows we need to change the antibiotic next week.  You can continue the pyridium as needed for bladder pain.  Seek care if symptoms do not improve with treatment.

## 2024-05-03 LAB — URINE CULTURE: Culture: <10000 — AB

## 2024-05-06 ENCOUNTER — Ambulatory Visit (HOSPITAL_COMMUNITY): Payer: Self-pay

## 2024-11-18 ENCOUNTER — Ambulatory Visit
Admission: RE | Admit: 2024-11-18 | Discharge: 2024-11-18 | Disposition: A | Discharge status: Home / Self Care | Attending: Family Medicine | Admitting: Family Medicine

## 2024-11-18 VITALS — BP 144/88 | HR 61 | Temp 98.1°F | Resp 20

## 2024-11-18 DIAGNOSIS — N390 Urinary tract infection, site not specified: Secondary | ICD-10-CM | POA: Insufficient documentation

## 2024-11-18 LAB — POCT URINE DIPSTICK
Bilirubin, UA: NEGATIVE
Glucose, UA: NEGATIVE mg/dL
Ketones, POC UA: NEGATIVE mg/dL
Nitrite, UA: NEGATIVE
POC PROTEIN,UA: NEGATIVE
Spec Grav, UA: 1.01
Urobilinogen, UA: 0.2 U/dL
pH, UA: 7

## 2024-11-18 MED ORDER — FLUCONAZOLE 150 MG PO TABS: 150.0000 mg | ORAL_TABLET | Freq: Once | ORAL | 0 refills | Status: AC

## 2024-11-18 MED ORDER — NITROFURANTOIN MONOHYD MACRO 100 MG PO CAPS: 100.0000 mg | ORAL_CAPSULE | Freq: Two times a day (BID) | ORAL | 0 refills | Status: AC

## 2024-11-18 NOTE — ED Triage Notes (Signed)
 Pt reports painful urination, burning sensation after urination, cramping lower back pain x 1 day.

## 2024-11-18 NOTE — ED Provider Notes (Signed)
 " [facility]    CSN: [id number] Arrival date & time: 11/18/24  1737      History   Chief Complaint Chief Complaint  Patient presents with   Urinary Frequency    Pain after I urinate - Entered by patient    HPI Michelle Pena is a 24 y.o. female.   Patient presenting today with 1 day history of dysuria, urinary frequency and urgency, lower back cramping.  Denies fever, chills, nausea, vomiting, vaginal discharge, concern for STIs or pregnancy.  LMP 11/11/2024.  So far not trying anything over-the-counter for symptoms.    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 No pertinent surgical history.  OB History   No obstetric history on file.      Home Medications    Prior to Admission medications  Medication Sig Start Date End Date Taking? Authorizing Provider  fluconazole  (DIFLUCAN ) 150 MG tablet Take 1 tablet (150 mg total) by mouth once for 1 dose. 11/18/24 11/18/24 Yes Stuart Vernell Norris, PA-C  nitrofurantoin , macrocrystal-monohydrate, (MACROBID ) 100 MG capsule Take 1 capsule (100 mg total) by mouth 2 (two) times daily. 11/18/24  Yes Stuart Vernell Norris, PA-C  phenazopyridine  (PYRIDIUM ) 200 MG tablet Take 1 tablet (200 mg total) by mouth 3 (three) times daily as needed for pain (bladder pain). 05/02/24   Chandra Harlene DELENA, NP  rosuvastatin (CRESTOR) 20 MG tablet Take 20 mg by mouth daily.    [provider]    Family History Family History  Problem Relation Age of Onset   Hyperlipidemia Mother    Migraines Mother    Diabetes Father    Hypertension Father     Social History Social History[1]   Allergies   Amoxicillin    Review of Systems Review of Systems PER HPI  Physical Exam Triage Vital Signs ED Triage Vitals  Encounter Vitals  Group     BP 11/18/24 1745 (!) 144/88     Girls Systolic BP Percentile --      Girls Diastolic BP Percentile --      Boys Systolic BP Percentile --      Boys Diastolic BP Percentile --      Pulse Rate 11/18/24 1745 61     Resp 11/18/24 1745 20     Temp 11/18/24 1745 98.1 F (36.7 C)     Temp Source 11/18/24 1745 Oral     SpO2 11/18/24 1745 96 %     Weight --      Height --      Head Circumference --      Peak Flow --      Pain Score 11/18/24 1748 2     Pain Loc --      Pain Education --      Exclude from Growth Chart --    No data found.  Updated Vital Signs BP (!) 144/88 (BP Location: Right Arm)   Pulse 61   Temp 98.1 F (36.7 C) (Oral)   Resp 20   LMP 11/11/2024 (Exact Date)   SpO2 96%   Visual Acuity Right Eye Distance:   Left Eye Distance:   Bilateral Distance:    Right Eye Near:   Left Eye Near:    Bilateral Near:     Physical Exam Vitals  and nursing note reviewed.  Constitutional:      Appearance: Normal appearance. She is not ill-appearing.  HENT:     Head: Atraumatic.  Eyes:     Extraocular Movements: Extraocular movements intact.     Conjunctiva/sclera: Conjunctivae normal.  Cardiovascular:     Rate and Rhythm: Normal rate.  Pulmonary:     Effort: Pulmonary effort is normal.  Abdominal:     General: Bowel sounds are normal. There is no distension.     Palpations: Abdomen is soft.     Tenderness: There is no abdominal tenderness. There is no right CVA tenderness, left CVA tenderness or guarding.  Musculoskeletal:        General: Normal range of motion.     Cervical back: Normal range of motion and neck supple.  Skin:    General: Skin is warm and dry.  Neurological:     Mental Status: She is alert and oriented to person, place, and time.  Psychiatric:        Mood and Affect: Mood normal.        Thought Content: Thought content normal.        Judgment: Judgment normal.      UC Treatments / Results  Labs (all labs ordered are listed, but  only abnormal results are displayed) Labs Reviewed  POCT URINE DIPSTICK - Abnormal; Notable for the following components:      Result Value   Blood, UA trace-lysed (*)    Leukocytes, UA Trace (*)    All other components within normal limits  URINE CULTURE    EKG   Radiology No results found.  Procedures Procedures (including critical care time)  Medications Ordered in UC Medications - No data to display  Initial Impression / Assessment and Plan / UC Course  I have reviewed the triage vital signs and the nursing notes.  Pertinent labs & imaging results that were available during my care of the patient were reviewed by me and considered in my medical decision making (see chart for details).     Urinalysis today with evidence of a possible urinary tract infection.  Urine culture pending.  Treat with Macrobid , she is requesting a Diflucan  in case of yeast infection additionally.  Supportive home care and return precautions reviewed.  Final Clinical Impressions(s) / UC Diagnoses   Final diagnoses:  Acute lower UTI   Discharge Instructions   None    ED Prescriptions     Medication Sig Dispense Auth. Provider   nitrofurantoin , macrocrystal-monohydrate, (MACROBID ) 100 MG capsule Take 1 capsule (100 mg total) by mouth 2 (two) times daily. 10 capsule Stuart Vernell Norris, PA-C   fluconazole  (DIFLUCAN ) 150 MG tablet Take 1 tablet (150 mg total) by mouth once for 1 dose. 1 tablet Stuart Vernell Norris, [place]      PDMP not reviewed this encounter.    [1]  Social History Tobacco Use   Smoking status: Never   Smokeless tobacco: Never  Vaping Use   Vaping status: Never Used  Substance Use Topics   Alcohol use: No    Alcohol/week: 0.0 standard drinks of alcohol   Drug use: No     Stuart Vernell Norris, [place] 11/18/24 1822  "

## 2024-11-19 LAB — URINE CULTURE: Culture: NO GROWTH

## 2024-11-20 ENCOUNTER — Ambulatory Visit: Payer: Self-pay
